# Patient Record
Sex: Male | Born: 1975
Health system: Southern US, Community
[De-identification: ages and names within clinical notes are randomized; demographics above are authoritative.]

## PROBLEM LIST (undated history)

## (undated) DIAGNOSIS — F410 Panic disorder [episodic paroxysmal anxiety] without agoraphobia: Secondary | ICD-10-CM

---

## 2011-06-12 ENCOUNTER — Encounter (HOSPITAL_COMMUNITY): Payer: Self-pay | Admitting: Emergency Medicine

## 2011-06-12 ENCOUNTER — Emergency Department (HOSPITAL_COMMUNITY): Payer: BC Managed Care – PPO

## 2011-06-12 ENCOUNTER — Emergency Department (HOSPITAL_COMMUNITY)
Admission: EM | Admit: 2011-06-12 | Discharge: 2011-06-12 | Disposition: A | Discharge status: Home / Self Care | Payer: BC Managed Care – PPO | Attending: Emergency Medicine | Admitting: Emergency Medicine

## 2011-06-12 DIAGNOSIS — J189 Pneumonia, unspecified organism: Secondary | ICD-10-CM | POA: Insufficient documentation

## 2011-06-12 DIAGNOSIS — R42 Dizziness and giddiness: Secondary | ICD-10-CM | POA: Insufficient documentation

## 2011-06-12 DIAGNOSIS — M549 Dorsalgia, unspecified: Secondary | ICD-10-CM | POA: Insufficient documentation

## 2011-06-12 DIAGNOSIS — R079 Chest pain, unspecified: Secondary | ICD-10-CM | POA: Insufficient documentation

## 2011-06-12 HISTORY — DX: Panic disorder (episodic paroxysmal anxiety): F41.0

## 2011-06-12 LAB — CBC
MCH: 31.8 pg (ref 26.0–34.0)
MCHC: 36.5 g/dL — ABNORMAL HIGH (ref 30.0–36.0)
MCV: 87.1 fL (ref 78.0–100.0)
Platelets: 310 10*3/uL (ref 150–400)
RBC: 4.59 MIL/uL (ref 4.22–5.81)

## 2011-06-12 LAB — D-DIMER, QUANTITATIVE: D-Dimer, Quant: 0.47 ug/mL-FEU (ref 0.00–0.48)

## 2011-06-12 LAB — DIFFERENTIAL
Eosinophils Absolute: 0 10*3/uL (ref 0.0–0.7)
Eosinophils Relative: 0 % (ref 0–5)
Lymphs Abs: 1.9 10*3/uL (ref 0.7–4.0)
Monocytes Absolute: 1.2 10*3/uL — ABNORMAL HIGH (ref 0.1–1.0)
Neutrophils Relative %: 82 % — ABNORMAL HIGH (ref 43–77)

## 2011-06-12 LAB — POCT I-STAT, CHEM 8
Calcium, Ion: 1.16 mmol/L (ref 1.12–1.32)
Chloride: 106 mEq/L (ref 96–112)
Creatinine, Ser: 0.7 mg/dL (ref 0.50–1.35)
Glucose, Bld: 115 mg/dL — ABNORMAL HIGH (ref 70–99)
Hemoglobin: 14.6 g/dL (ref 13.0–17.0)
Potassium: 3.6 mEq/L (ref 3.5–5.1)

## 2011-06-12 MED ORDER — MOXIFLOXACIN HCL 400 MG PO TABS: 400.0000 mg | ORAL_TABLET | Freq: Every day | ORAL | Status: AC

## 2011-06-12 MED ORDER — ACETAMINOPHEN 325 MG PO TABS
650.0000 mg | ORAL_TABLET | Freq: Once | ORAL | Status: AC
  Administered 2011-06-12: 650 mg via ORAL
  Filled 2011-06-12: qty 2

## 2011-06-12 NOTE — ED Notes (Signed)
Also states has hx panic attacks

## 2011-06-12 NOTE — ED Provider Notes (Signed)
History     CSN: [id number]  Arrival date & time 06/12/11  4098   First MD Initiated Contact with Patient 06/12/11 1955      Chief Complaint  Patient presents with  . Near Syncope  . Chest Pain  . Back Pain    (Consider location/radiation/quality/duration/timing/severity/associated sxs/prior treatment) Patient is a 36 y.o. male presenting with chest pain and back pain. The history is provided by the patient.  Chest Pain Primary symptoms include dizziness. Pertinent negatives for primary symptoms include no shortness of breath, no abdominal pain, no nausea and no vomiting.  Dizziness also occurs with diaphoresis. Dizziness does not occur with nausea, vomiting or weakness.  Associated symptoms include diaphoresis.  Pertinent negatives for associated symptoms include no numbness and no weakness.    Back Pain  Associated symptoms include chest pain. Pertinent negatives include no numbness, no headaches, no abdominal pain and no weakness.   patient has had back pain for last 2 days. He states a good friend who was in his 30s died of stage IV lung cancer. He states he's not been dealing with it very well. He states he developed some pain in his upper right back. It is worse with movement or with palpation. He states it feels like a muscle cramp. He states he was at his kids preschool function and a friend asked about his other friend that died. He states after this she began to have chest pain and he became sweaty and felt like passing out he smokes occasionally. He states that his father had his first heart attack at 36 years old. Patient states the pain is worse with deep breathing. No fevers. No cough. Substance abuse. He has no history of heart problems.   Past Medical History  Diagnosis Date  . Panic attacks     History reviewed. No pertinent past surgical history.  History reviewed. No pertinent family history.  History  Substance Use Topics  . Smoking status: Current Some Day  Smoker  . Smokeless tobacco: Not on file  . Alcohol Use: Yes     occasionally      Review of Systems  Constitutional: Positive for diaphoresis. Negative for activity change and appetite change.  HENT: Negative for neck stiffness.   Eyes: Negative for pain.  Respiratory: Negative for chest tightness and shortness of breath.   Cardiovascular: Positive for chest pain. Negative for leg swelling.  Gastrointestinal: Negative for nausea, vomiting, abdominal pain and diarrhea.  Genitourinary: Negative for flank pain.  Musculoskeletal: Positive for back pain.  Skin: Negative for rash.  Neurological: Positive for dizziness. Negative for weakness, numbness and headaches.  Psychiatric/Behavioral: Negative for behavioral problems.    Allergies  Review of patient's allergies indicates no known allergies.  Home Medications   Current Outpatient Rx  Name Route Sig Dispense Refill  . ALPRAZOLAM 0.5 MG PO TABS Oral Take 0.5 mg by mouth 3 (three) times daily as needed. For anxiety.    Marland Kitchen BACLOFEN 10 MG PO TABS Oral Take 10 mg by mouth 3 (three) times daily.    . IBUPROFEN 800 MG PO TABS Oral Take 800 mg by mouth every 8 (eight) hours as needed. For pain.    . ADULT MULTIVITAMIN W/MINERALS CH Oral Take 1 tablet by mouth daily.    Marland Kitchen OMEPRAZOLE 20 MG PO CPDR Oral Take 20-40 mg by mouth daily.    Marland Kitchen MOXIFLOXACIN HCL 400 MG PO TABS Oral Take 1 tablet (400 mg total) by mouth daily. 7 tablet 0  BP 113/51  Pulse 81  Resp 20  SpO2 93%  Physical Exam  Nursing note and vitals reviewed. Constitutional: He is oriented to person, place, and time. He appears well-developed and well-nourished.  HENT:  Head: Normocephalic and atraumatic.  Eyes: EOM are normal. Pupils are equal, round, and reactive to light.  Neck: Normal range of motion. Neck supple.  Cardiovascular: Normal rate, regular rhythm and normal heart sounds.   No murmur heard. Pulmonary/Chest: Effort normal and breath sounds normal.    Abdominal: Soft. Bowel sounds are normal. He exhibits no distension and no mass. There is no tenderness. There is no rebound and no guarding.  Musculoskeletal: Normal range of motion. He exhibits no edema.       Mild tenderness to right paraspinal area in his mid-back. Not worse with movement. No rash.  Neurological: He is alert and oriented to person, place, and time. No cranial nerve deficit.  Skin: Skin is warm and dry.  Psychiatric: He has a normal mood and affect.    ED Course  Procedures (including critical care time)  Labs Reviewed  CBC - Abnormal; Notable for the following:    WBC 17.5 (*)    MCHC 36.5 (*)    All other components within normal limits  DIFFERENTIAL - Abnormal; Notable for the following:    Neutrophils Relative 82 (*)    Lymphocytes Relative 11 (*)    Neutro Abs 14.4 (*)    Monocytes Absolute 1.2 (*)    All other components within normal limits  POCT I-STAT, CHEM 8 - Abnormal; Notable for the following:    Glucose, Bld 115 (*)    All other components within normal limits  D-DIMER, QUANTITATIVE  TROPONIN I   Dg Chest 2 View  06/12/2011  *RADIOLOGY REPORT*  Clinical Data: Back pain and chest pain.  CHEST - 2 VIEW  Comparison: None  Findings: The cardiac silhouette, mediastinal and hilar contours are within normal limits.  Patchy right middle and lower lobe infiltrates are noted. Possible area of pleural calcification.  The left lung is clear except for minimal left basilar atelectasis.  No effusions.  IMPRESSION: Right basilar infiltrates and possible pleural calcification.  Original Report Authenticated By: P. Loralie Champagne, M.D.     1. CAP (community acquired pneumonia)      Date: 56  Rhythm: normal sinus rhythm  QRS Axis: normal  Intervals: normal  ST/T Wave abnormalities: normal  Conduction Disutrbances:none  Narrative Interpretation:   Old EKG Reviewed: unchanged    MDM  Right-sided back pain. White count is elevated and patient has  pneumonia on x-ray. Patient with antibiotics for followup his primary care Dr. EKG is reassuring. There are possible pleural calcifications that will need to be followed.Juliet Rude. Rubin Payor, MD 06/13/11 0000

## 2011-06-12 NOTE — Discharge Instructions (Signed)

## 2011-06-12 NOTE — ED Notes (Signed)
To ED via [facility] from kids [facility] function after having a near syncopal episode tonight. Has been under stress in the past week-- a good friend died last week, has a "knot in back" muscle spasm under Right Scapula, hurts to breath, move,

## 2013-02-04 IMAGING — CR DG CHEST 2V
2 series · 2 of 2 positions shown · non-contrast
Comparison: None

CLINICAL DATA: Back pain and chest pain.

CHEST - 2 VIEW

[w chest pa]
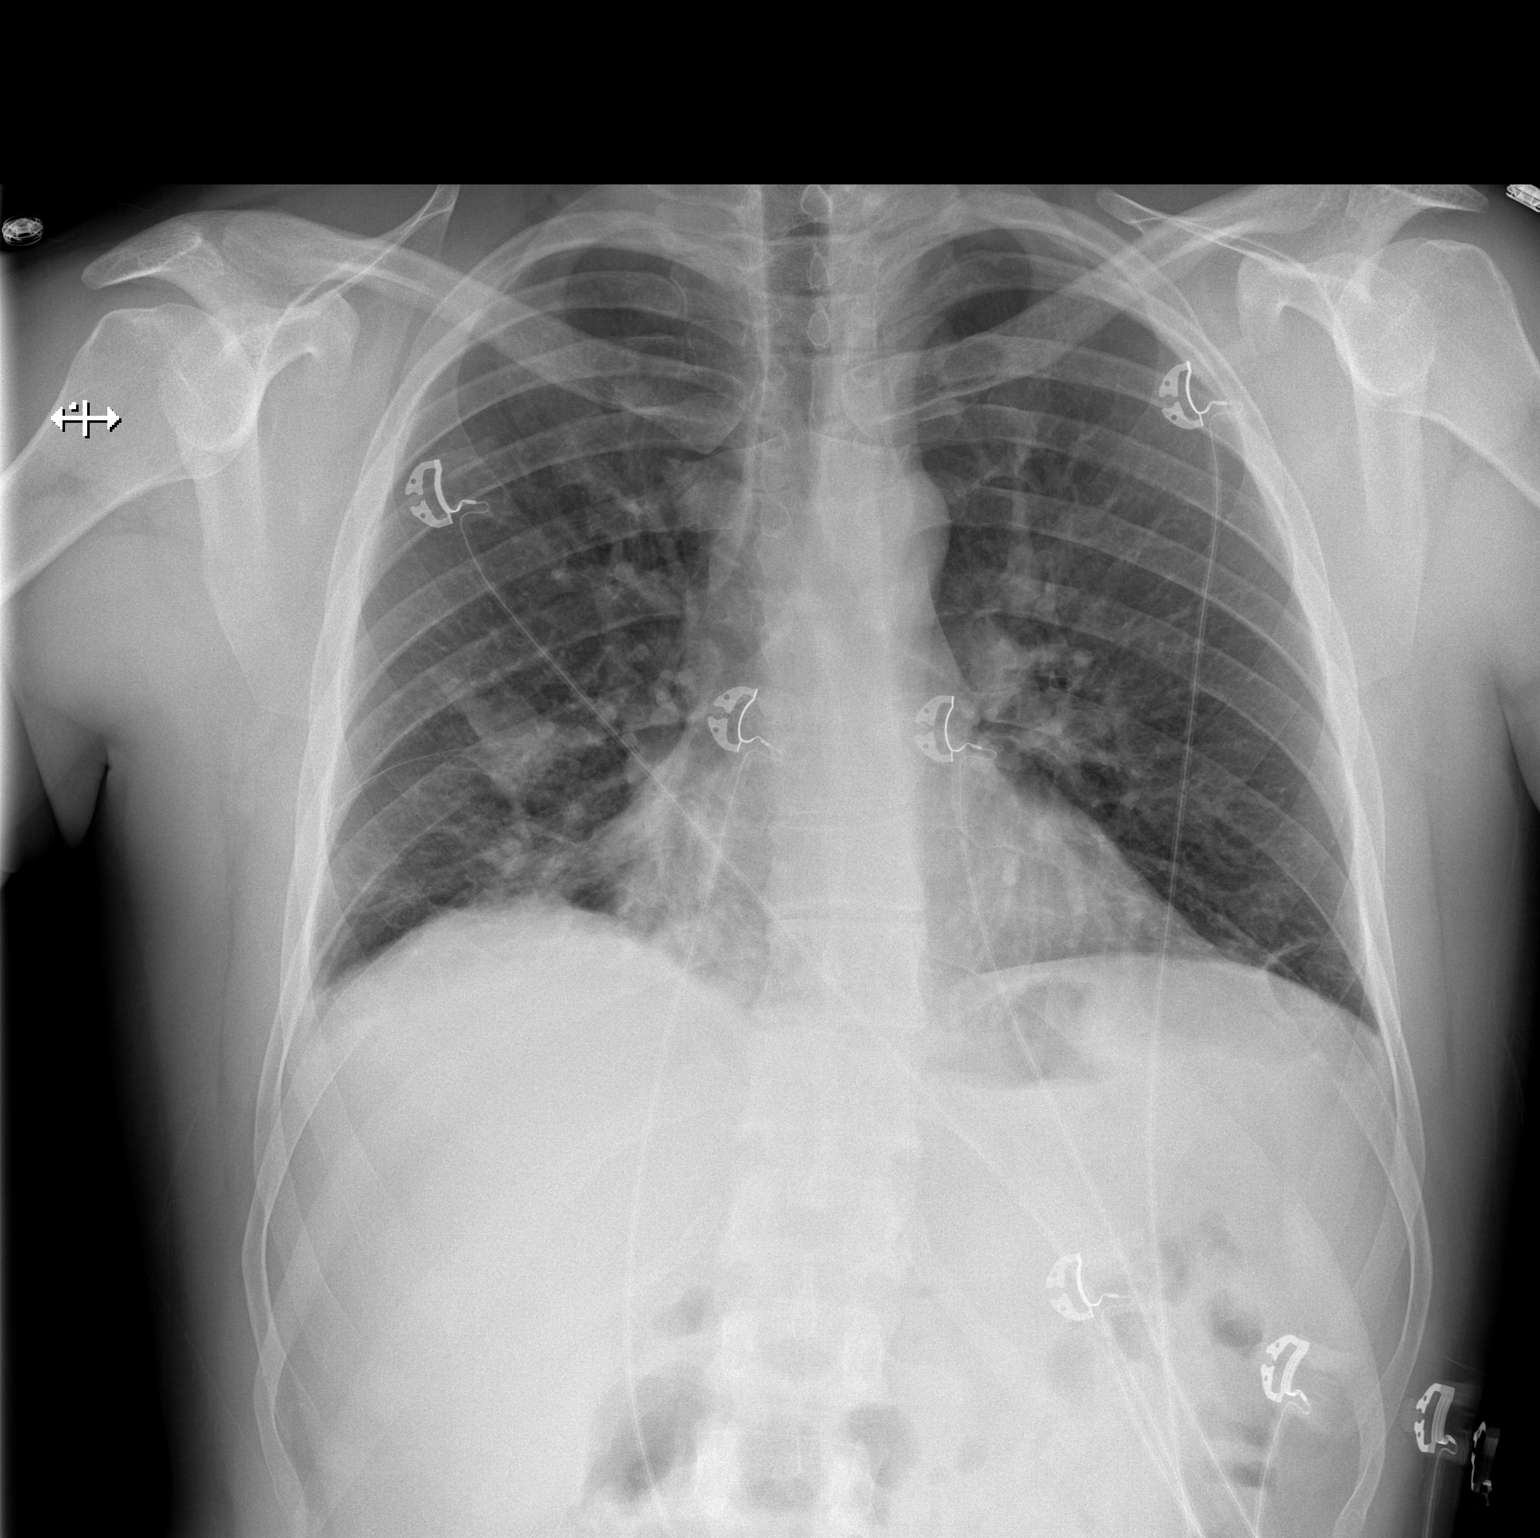

[w chest lat]
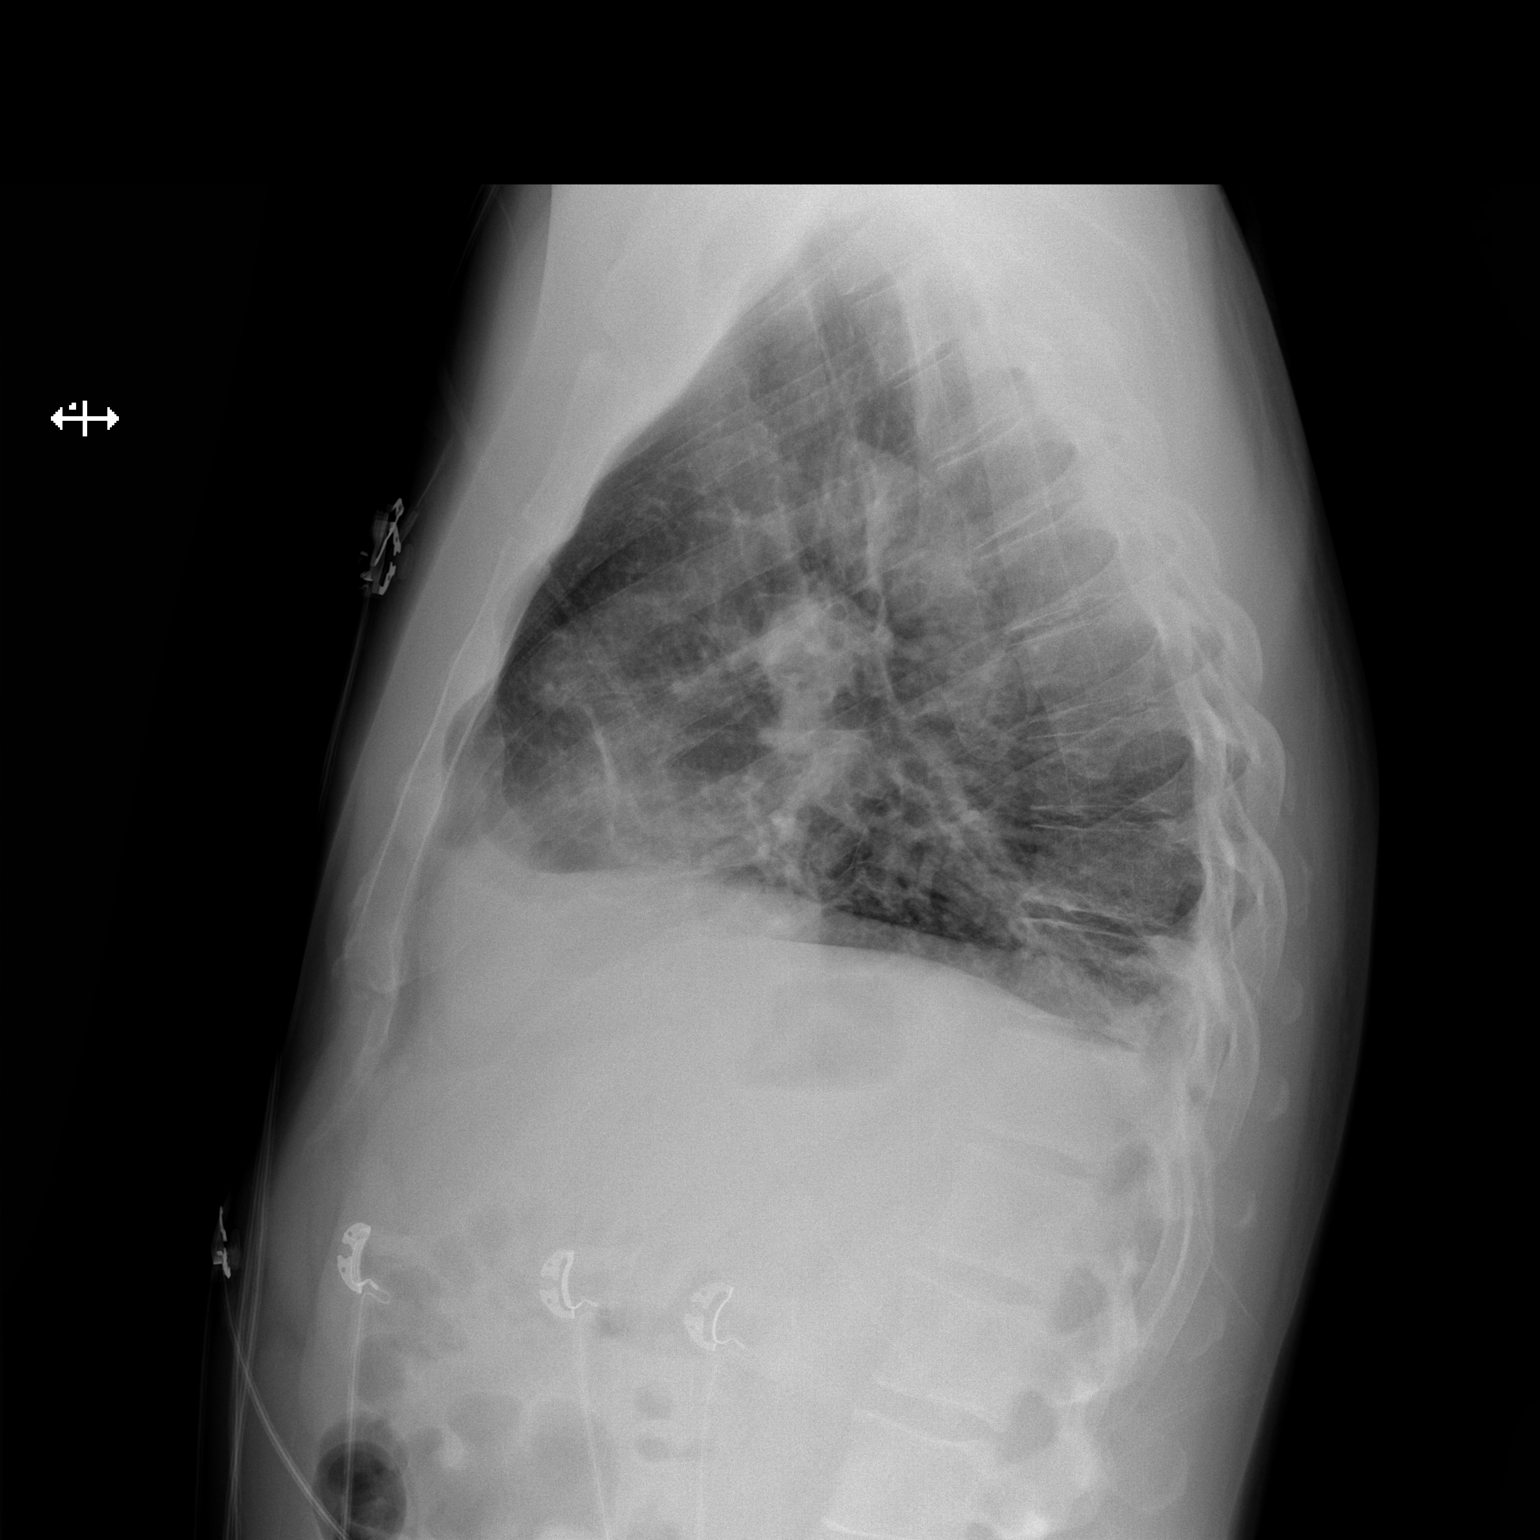

[2 of 2 positions shown; findings below may reference images not displayed]

FINDINGS: The cardiac silhouette, mediastinal and hilar contours
are within normal limits.  Patchy right middle and lower lobe
infiltrates are noted. Possible area of pleural calcification.  The
left lung is clear except for minimal left basilar atelectasis.  No
effusions.
IMPRESSION: Right basilar infiltrates and possible pleural calcification.

## 2019-07-16 DIAGNOSIS — Z03818 Encounter for observation for suspected exposure to other biological agents ruled out: Secondary | ICD-10-CM | POA: Diagnosis not present

## 2019-07-16 DIAGNOSIS — Z20822 Contact with and (suspected) exposure to covid-19: Secondary | ICD-10-CM | POA: Diagnosis not present

## 2019-07-28 DIAGNOSIS — K219 Gastro-esophageal reflux disease without esophagitis: Secondary | ICD-10-CM | POA: Diagnosis not present

## 2019-07-28 DIAGNOSIS — F419 Anxiety disorder, unspecified: Secondary | ICD-10-CM | POA: Diagnosis not present

## 2019-07-28 DIAGNOSIS — M25511 Pain in right shoulder: Secondary | ICD-10-CM | POA: Diagnosis not present

## 2019-07-28 DIAGNOSIS — Z13220 Encounter for screening for lipoid disorders: Secondary | ICD-10-CM | POA: Diagnosis not present

## 2019-07-28 DIAGNOSIS — Z0000 Encounter for general adult medical examination without abnormal findings: Secondary | ICD-10-CM | POA: Diagnosis not present

## 2019-09-06 DIAGNOSIS — D72829 Elevated white blood cell count, unspecified: Secondary | ICD-10-CM | POA: Diagnosis not present

## 2019-10-03 ENCOUNTER — Telehealth: Payer: Self-pay | Admitting: Hematology and Oncology

## 2019-10-03 NOTE — Telephone Encounter (Signed)
Mr. Retherford  Has been referred to our office for leukocytosis by Valora Piccolo, PA. Pt returned my call and has been scheduled to see Dr. Pamelia Hoit on 9/13 at 10:15am. Pt aware to ariive 15 minutes early. Letter mailed.

## 2019-10-23 NOTE — Progress Notes (Signed)
[facility] CONSULT NOTE  Patient Care Team: Mayer Camel, MD as PCP - General (Family Medicine)  CHIEF COMPLAINTS/PURPOSE OF CONSULTATION:  Newly diagnosed leukocytosis  HISTORY OF PRESENTING ILLNESS:  Thomas Nelson 44 y.o. male is here because of recent diagnosis of leukocytosis. He is referred by Anell Barr, PA. Labs on 07/28/19 showed WBC 11.9 and on 09/06/19 showed WBC 11.3, ANC 7.8, Hg 16.9, HCT 47.4, platelets 408. She presents to the clinic today for initial evaluation.  He tells me that he was a heavy alcoholic and had quit drinking alcohol about 5 years ago and since then his health has improved tremendously.  In fact in 2013 he had a white count that was markedly elevated up to 17.5 but it has slowly improved over time.  The most recent blood work showed normalization of liver function tests.  He stays active with exercising regularly.  I reviewed his records extensively and collaborated the history with the patient.   MEDICAL HISTORY:  Past Medical History:  Diagnosis Date   Panic attacks     SURGICAL HISTORY: No past surgical history on file.  SOCIAL HISTORY: Social History   Socioeconomic History   Marital status: Married    Spouse name: Not on file   Number of children: Not on file   Years of education: Not on file   Highest education level: Not on file  Occupational History   Not on file  Tobacco Use   Smoking status: Current Some Day Smoker  Substance and Sexual Activity   Alcohol use: Yes    Comment: occasionally   Drug use: No   Sexual activity: Not on file  Other Topics Concern   Not on file  Social History Narrative   Not on file   Social Determinants of Health   Financial Resource Strain:    Difficulty of Paying Living Expenses: Not on file  Food Insecurity:    Worried About Running Out of Food in the Last Year: Not on file   Ran Out of Food in the Last Year: Not on file  Transportation Needs:    Lack of  Transportation (Medical): Not on file   Lack of Transportation (Non-Medical): Not on file  Physical Activity:    Days of Exercise per Week: Not on file   Minutes of Exercise per Session: Not on file  Stress:    Feeling of Stress : Not on file  Social Connections:    Frequency of Communication with Friends and Family: Not on file   Frequency of Social Gatherings with Friends and Family: Not on file   Attends Religious Services: Not on file   Active Member of Clubs or Organizations: Not on file   Attends Banker Meetings: Not on file   Marital Status: Not on file  Intimate Partner Violence:    Fear of Current or Ex-Partner: Not on file   Emotionally Abused: Not on file   Physically Abused: Not on file   Sexually Abused: Not on file    FAMILY HISTORY: No family history on file.  ALLERGIES:  has No Known Allergies.  MEDICATIONS:  Current Outpatient Medications  Medication Sig Dispense Refill   omeprazole (PRILOSEC) 20 MG capsule Take 20-40 mg by mouth daily.     No current facility-administered medications for this visit.    REVIEW OF SYSTEMS:     All other systems were reviewed with the patient and are negative.  PHYSICAL EXAMINATION: ECOG PERFORMANCE STATUS: 1 - Symptomatic but  completely ambulatory  Vitals:   10/24/19 1027  BP: 106/77  Pulse: 77  Resp: 18  Temp: 97.7 F (36.5 C)  SpO2: 100%   Filed Weights   10/24/19 1027  Weight: 177 lb 8 oz (80.5 kg)      LABORATORY DATA:  I have reviewed the data as listed Lab Results  Component Value Date   WBC 17.5 (H) 06/12/2011   HGB 14.6 06/12/2011   HCT 43.0 06/12/2011   MCV 87.1 06/12/2011   PLT 310 06/12/2011   Lab Results  Component Value Date   NA 141 06/12/2011   K 3.6 06/12/2011   CL 106 06/12/2011    RADIOGRAPHIC STUDIES: I have personally reviewed the radiological reports and agreed with the findings in the report.  ASSESSMENT AND PLAN:  Leukocytosis Lab  review 06/12/2011: WBC 17.5, ANC 14.4, hemoglobin 14.6, platelets 310 07/28/19 WBC 11.9, ANC 7.7 09/06/19 WBC 11.3, ANC 7.8, Hg 16.9, HCT 47.4, platelets 408.  Differential diagnosis of chronic neutrophilia 1. Inflammations: The only probable inflammation is  2. infections: Unlikely 3. Medications: Patient does not take any steroids 4. Bone marrow disorders  Workup: Based on the lab results, no need to additional work ups since the elevated WBC is primarily elevated neutrophils which is most definitely related to underlying inflammation.     All questions were answered. The patient knows to call the clinic with any problems, questions or concerns.   Sabas Sous, MD, MPH 10/24/2019    I, Molly Dorshimer, am acting as scribe for Serena Croissant, MD.  I have reviewed the above documentation for accuracy and completeness, and I agree with the above.

## 2019-10-24 ENCOUNTER — Other Ambulatory Visit: Payer: Self-pay

## 2019-10-24 ENCOUNTER — Inpatient Hospital Stay: Payer: BC Managed Care – PPO | Attending: Hematology and Oncology | Admitting: Hematology and Oncology

## 2019-10-24 DIAGNOSIS — D72829 Elevated white blood cell count, unspecified: Secondary | ICD-10-CM | POA: Diagnosis not present

## 2019-10-24 DIAGNOSIS — D72828 Other elevated white blood cell count: Secondary | ICD-10-CM | POA: Diagnosis not present

## 2019-10-24 DIAGNOSIS — F17200 Nicotine dependence, unspecified, uncomplicated: Secondary | ICD-10-CM | POA: Insufficient documentation

## 2019-10-24 NOTE — Assessment & Plan Note (Addendum)
Lab review 06/12/2011: WBC 17.5, ANC 14.4, hemoglobin 14.6, platelets 310 07/28/19 WBC 11.9, ANC 7.7 09/06/19 WBC 11.3, ANC 7.8, Hg 16.9, HCT 47.4, platelets 408.  Differential diagnosis of chronic neutrophilia 1. Inflammations: The only probable inflammation is  2. infections: Unlikely 3. Medications: Patient does not take any steroids 4. Bone marrow disorders  Workup: Based on the lab results, no need to additional work ups since the elevated WBC is primarily elevated neutrophils which is most definitely related to underlying inflammation.  Elevated the hemoglobin levels: I discussed the patient about the differential diagnosis of polycythemia differential between primary versus secondary. I plan to obtain JAK2 mutation testing. We discussed the different causes of secondary polycythemia including obstructive sleep apnea and other hypoxia treatment states

## 2019-10-25 ENCOUNTER — Telehealth: Payer: Self-pay | Admitting: Hematology and Oncology

## 2019-10-25 NOTE — Telephone Encounter (Signed)
No 9/13 los, no changes made to pt schedule  

## 2020-01-27 DIAGNOSIS — F419 Anxiety disorder, unspecified: Secondary | ICD-10-CM | POA: Diagnosis not present

## 2020-01-27 DIAGNOSIS — R002 Palpitations: Secondary | ICD-10-CM | POA: Diagnosis not present

## 2020-01-29 DIAGNOSIS — I499 Cardiac arrhythmia, unspecified: Secondary | ICD-10-CM | POA: Diagnosis not present

## 2020-03-08 DIAGNOSIS — F410 Panic disorder [episodic paroxysmal anxiety] without agoraphobia: Secondary | ICD-10-CM | POA: Diagnosis not present

## 2020-03-08 DIAGNOSIS — F4322 Adjustment disorder with anxiety: Secondary | ICD-10-CM | POA: Diagnosis not present

## 2020-04-05 DIAGNOSIS — F410 Panic disorder [episodic paroxysmal anxiety] without agoraphobia: Secondary | ICD-10-CM | POA: Diagnosis not present

## 2020-04-05 DIAGNOSIS — F4322 Adjustment disorder with anxiety: Secondary | ICD-10-CM | POA: Diagnosis not present

## 2020-06-29 DIAGNOSIS — F40298 Other specified phobia: Secondary | ICD-10-CM | POA: Diagnosis not present

## 2020-06-29 DIAGNOSIS — F4322 Adjustment disorder with anxiety: Secondary | ICD-10-CM | POA: Diagnosis not present

## 2020-07-06 DIAGNOSIS — F4322 Adjustment disorder with anxiety: Secondary | ICD-10-CM | POA: Diagnosis not present

## 2020-07-06 DIAGNOSIS — F40298 Other specified phobia: Secondary | ICD-10-CM | POA: Diagnosis not present

## 2020-07-13 DIAGNOSIS — F4322 Adjustment disorder with anxiety: Secondary | ICD-10-CM | POA: Diagnosis not present

## 2020-07-13 DIAGNOSIS — F40298 Other specified phobia: Secondary | ICD-10-CM | POA: Diagnosis not present

## 2020-07-20 DIAGNOSIS — F40298 Other specified phobia: Secondary | ICD-10-CM | POA: Diagnosis not present

## 2020-07-20 DIAGNOSIS — F4322 Adjustment disorder with anxiety: Secondary | ICD-10-CM | POA: Diagnosis not present

## 2020-07-26 ENCOUNTER — Ambulatory Visit: Payer: BC Managed Care – PPO | Admitting: Behavioral Health

## 2020-08-01 ENCOUNTER — Ambulatory Visit (INDEPENDENT_AMBULATORY_CARE_PROVIDER_SITE_OTHER): Payer: BC Managed Care – PPO | Admitting: Behavioral Health

## 2020-08-01 ENCOUNTER — Encounter: Payer: Self-pay | Admitting: Behavioral Health

## 2020-08-01 ENCOUNTER — Other Ambulatory Visit: Payer: Self-pay

## 2020-08-01 VITALS — BP 91/55 | HR 81 | Ht 69.0 in | Wt 180.0 lb

## 2020-08-01 DIAGNOSIS — F330 Major depressive disorder, recurrent, mild: Secondary | ICD-10-CM

## 2020-08-01 DIAGNOSIS — F409 Phobic anxiety disorder, unspecified: Secondary | ICD-10-CM

## 2020-08-01 DIAGNOSIS — F411 Generalized anxiety disorder: Secondary | ICD-10-CM | POA: Diagnosis not present

## 2020-08-01 DIAGNOSIS — F4001 Agoraphobia with panic disorder: Secondary | ICD-10-CM | POA: Diagnosis not present

## 2020-08-01 MED ORDER — HYDROXYZINE HCL 50 MG PO TABS: ORAL_TABLET | ORAL | 1 refills | Status: AC

## 2020-08-01 MED ORDER — ALPRAZOLAM 0.5 MG PO TABS: 0.5000 mg | ORAL_TABLET | Freq: Two times a day (BID) | ORAL | 0 refills | Status: AC | PRN

## 2020-08-01 MED ORDER — SERTRALINE HCL 50 MG PO TABS: ORAL_TABLET | ORAL | 1 refills | Status: AC

## 2020-08-01 NOTE — Progress Notes (Signed)
[facility] MD/PA/NP Initial Note  08/01/2020 4:02 PM Thomas Nelson  MRN:  [id number]  Chief Complaint:   HPI:  45 year old male presents to this office for initial visit and to establish care. He says that he has a history of alcohol abuse but has been ETOH free for 6 years. Says he still practices techniques  and has no desire to drink. Says this past year that he has been going through separation and pending divorce with his wife. He has been married since 2006. Says that his wife has moved on prior to divorce being finalized with another woman. Says his children splits time and stays with him every other weekend. Says that the added stress has increase his anxiety levels. He said that over the last few years he has tried several other medication for anxiety and depression but did not take them for long periods. Says the last medication he was on was zoloft  50 mg over one year ago. Says he cannot remember if it really worked or not. 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 Says he can only take short cuts or cut through's. He was recently driving the back roads to [place] with his children and he got stuck with no way to get back home except the highway. Says he experienced palpitations, trouble breathing and "sensory overload" with the thought of getting on the highway. He is taking a train today to [place] because he cannot drive on highway. He does not understand how this phobia developed or what triggered it. He says that he would like to start SSRI again along with medication to help his panic attacks. He reports his anxiety today at 6/10 and depression 2/10. He reports sleeping 7-8 hours per night.No mania, no psychosis, No SI/HI.  Prior psychiatric medication trials : Wellbutrin: was only  two days. Said made him cry Paxil Zoloft   Visit Diagnosis:    ICD-10-CM   1. 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 No pertinent surgical history.  Family Psychiatric History: see chart  Family History:  Family History  Problem Relation Age of Onset   Alcohol abuse Mother    Anxiety disorder Mother     Social History:  Social History   Socioeconomic History   Marital status: Married    Spouse name: Theatre manager   Number of children: 3   Years of education: Not on file   Highest education level: Associate degree: academic program  Occupational History   Occupation: Insurance  Tobacco Use   Smoking status: Some Days    Pack years: 0.00   Smokeless tobacco: Not on file  Vaping Use   Vaping Use: Every day   Substances: Nicotine  Substance and Sexual Activity   Alcohol use: Not Currently    Comment: occasionally  Drug use: No   Sexual activity: Not Currently  Other Topics Concern   Not on file  Social History Narrative   Live alone. Legally separated. Has children every other weekend.    Social Determinants of Health   Financial Resource Strain: Not on file  Food Insecurity: Not on file  Transportation Needs: Not on file  Physical Activity: Not on file  Stress: Not on file  Social Connections: Not on file    Allergies: No Known Allergies  Metabolic Disorder Labs: No results found for: HGBA1C, MPG No results found for: PROLACTIN No results found for: CHOL, TRIG, HDL, CHOLHDL, VLDL, LDLCALC No results found for: TSH  Therapeutic Level Labs: No  results found for: LITHIUM No results found for: VALPROATE No components found for:  CBMZ  Current Medications: Current Outpatient Medications  Medication Sig Dispense Refill   ALPRAZolam (XANAX) 0.5 MG tablet Take 1 tablet (0.5 mg total) by mouth 2 (two) times daily as needed for anxiety. 28 tablet 0   hydrOXYzine (ATARAX/VISTARIL) 50 MG tablet 1/2 tablet 25 mg for 7 days, then one whole tablet 50 mg. 30 tablet 1   sertraline (ZOLOFT) 50 MG tablet 1/2 tablet 25 mg for 7 days, then one whole tablet 50 mg. 30 tablet 1   omeprazole (PRILOSEC) 20 MG capsule Take 20-40 mg by mouth daily.     No current facility-administered medications for this visit.    Medication Side Effects: none  Orders placed this visit:  No orders of the defined types were placed in this encounter.   Psychiatric Specialty Exam:  Review of Systems  Musculoskeletal:  Negative for gait problem.  Neurological:  Negative for tremors.   There were no vitals taken for this visit.There is no height or weight on file to calculate BMI.  General Appearance: Casual, Neat, and Well Groomed  Eye Contact:  Good  Speech:  Clear and Coherent and Talkative  Volume:  Normal  Mood:  Anxious  Affect:  Appropriate  Thought Process:  Coherent  Orientation:  Full (Time, Place, and Person)  Thought Content: Logical   Suicidal Thoughts:  No  Homicidal Thoughts:  No  Memory:  WNL  Judgement:  Good  Insight:  Good  Psychomotor Activity:  Normal  Concentration:  Concentration: Good  Recall:  Good  Fund of Knowledge: Good  Language: Good  Assets:  Desire for Improvement Resilience  ADL's:  Intact  Cognition: WNL  Prognosis:  Good   Screenings:  PHQ2-9    Flowsheet Row Office Visit from 08/01/2020 in [facility]  PHQ-2 Total Score 1       Receiving Psychotherapy: Yes Pecola Lawless  Treatment Plan/Recommendations:  Greater than 50% of 45 min face to face time with patient was spent on counseling  and coordination of care. We discussed history of anxiety and panic attacks. Discussed history of ETOH and progress.We discussed Discussed potential benefits, risk, and side effects of benzodiazepines to include potential risk of tolerance and dependence, as well as possible drowsiness.  Advised patient not to drive if experiencing drowsiness and to take lowest possible effective dose to minimize risk of dependence and tolerance.  Will start hydroxyzine 25 mg three times daily prn for 7 days. Then may increase to 50 mg three times daily prn for anxiety or sleep. One refill Start  #28 xanax 0.5 mg twice daily prn. Use sparingly if hydroxyzine has failed to control anxiety or panic Start Zoloft 1/2 tablet 25 mg for 7 days, then one  whole tablet 50 mg.  Will report and side effect or worsening symptoms Follow up in 4 weeks to reassess  Patient advised to recheck his BP regularly. Low Conduct GAD 7 next visit    Joan Flores, NP

## 2020-08-03 DIAGNOSIS — F40298 Other specified phobia: Secondary | ICD-10-CM | POA: Diagnosis not present

## 2020-08-03 DIAGNOSIS — F4322 Adjustment disorder with anxiety: Secondary | ICD-10-CM | POA: Diagnosis not present

## 2020-08-14 ENCOUNTER — Telehealth: Payer: Self-pay | Admitting: Behavioral Health

## 2020-08-14 NOTE — Telephone Encounter (Signed)
Pt informed

## 2020-08-14 NOTE — Telephone Encounter (Signed)
Contact patient and tell him to decrease his dose back down to 25 mg until his next appointment with me. Provide emergency contact info and tell him to call back if problems persist after a few days.

## 2020-08-14 NOTE — Telephone Encounter (Signed)
Please review

## 2020-08-14 NOTE — Telephone Encounter (Signed)
Next visit is 08/31/20. Thomas Nelson called about his Zoloft. He was started on Zoloft 25 mg and then it was increased to 50 mg and this is his 2nd day for increase. Since he starting the increase he can't collect his thoughts and can't come up with words to say. He also said he has intrusive thoughts and doesn't feel good about the thoughts. His phone number is [phone]. Pharmacy is [facility], [address] [place], [place]. Phone number is [phone].

## 2020-08-20 DIAGNOSIS — F4322 Adjustment disorder with anxiety: Secondary | ICD-10-CM | POA: Diagnosis not present

## 2020-08-20 DIAGNOSIS — F40298 Other specified phobia: Secondary | ICD-10-CM | POA: Diagnosis not present

## 2020-08-21 ENCOUNTER — Telehealth: Payer: Self-pay | Admitting: Behavioral Health

## 2020-08-21 NOTE — Telephone Encounter (Signed)
Pt stated he has extreme fatigue and he switched to taking at nighttime and it did not help. He is lethargic and in a "fog". He wants to stop and needs to know if he has to wean off

## 2020-08-21 NOTE — Telephone Encounter (Signed)
Half tablet for one week and then 1/4 tablet for one week and then stop.

## 2020-08-21 NOTE — Telephone Encounter (Signed)
Pt called in asking how he can completely stop taking Sertraline. Pt said that it is not working foe him at all. Please call [phone]

## 2020-08-21 NOTE — Telephone Encounter (Signed)
LVM for pt to return call

## 2020-08-21 NOTE — Telephone Encounter (Signed)
LVM

## 2020-08-21 NOTE — Telephone Encounter (Signed)
The sertraline.

## 2020-08-21 NOTE — Telephone Encounter (Signed)
What medication? He is on several.

## 2020-08-31 ENCOUNTER — Ambulatory Visit: Payer: BC Managed Care – PPO | Admitting: Behavioral Health

## 2020-09-07 ENCOUNTER — Ambulatory Visit: Payer: BC Managed Care – PPO | Admitting: Behavioral Health

## 2020-09-14 DIAGNOSIS — F40298 Other specified phobia: Secondary | ICD-10-CM | POA: Diagnosis not present

## 2020-09-14 DIAGNOSIS — F4322 Adjustment disorder with anxiety: Secondary | ICD-10-CM | POA: Diagnosis not present

## 2020-09-27 ENCOUNTER — Ambulatory Visit: Payer: BC Managed Care – PPO | Admitting: Behavioral Health

## 2020-10-04 DIAGNOSIS — F4322 Adjustment disorder with anxiety: Secondary | ICD-10-CM | POA: Diagnosis not present

## 2020-10-04 DIAGNOSIS — F40298 Other specified phobia: Secondary | ICD-10-CM | POA: Diagnosis not present

## 2020-10-18 DIAGNOSIS — F4322 Adjustment disorder with anxiety: Secondary | ICD-10-CM | POA: Diagnosis not present

## 2020-10-18 DIAGNOSIS — F40298 Other specified phobia: Secondary | ICD-10-CM | POA: Diagnosis not present

## 2020-11-07 DIAGNOSIS — F4322 Adjustment disorder with anxiety: Secondary | ICD-10-CM | POA: Diagnosis not present

## 2020-11-07 DIAGNOSIS — F40298 Other specified phobia: Secondary | ICD-10-CM | POA: Diagnosis not present

## 2020-11-16 DIAGNOSIS — F4322 Adjustment disorder with anxiety: Secondary | ICD-10-CM | POA: Diagnosis not present

## 2020-11-16 DIAGNOSIS — F40298 Other specified phobia: Secondary | ICD-10-CM | POA: Diagnosis not present

## 2020-11-30 DIAGNOSIS — F40298 Other specified phobia: Secondary | ICD-10-CM | POA: Diagnosis not present

## 2020-11-30 DIAGNOSIS — F4322 Adjustment disorder with anxiety: Secondary | ICD-10-CM | POA: Diagnosis not present

## 2020-12-27 DIAGNOSIS — F4322 Adjustment disorder with anxiety: Secondary | ICD-10-CM | POA: Diagnosis not present

## 2020-12-27 DIAGNOSIS — F40298 Other specified phobia: Secondary | ICD-10-CM | POA: Diagnosis not present

## 2021-01-10 DIAGNOSIS — F4322 Adjustment disorder with anxiety: Secondary | ICD-10-CM | POA: Diagnosis not present

## 2021-01-10 DIAGNOSIS — F40298 Other specified phobia: Secondary | ICD-10-CM | POA: Diagnosis not present

## 2021-01-29 DIAGNOSIS — F4322 Adjustment disorder with anxiety: Secondary | ICD-10-CM | POA: Diagnosis not present

## 2021-01-29 DIAGNOSIS — F40298 Other specified phobia: Secondary | ICD-10-CM | POA: Diagnosis not present

## 2021-02-21 DIAGNOSIS — F40298 Other specified phobia: Secondary | ICD-10-CM | POA: Diagnosis not present

## 2021-02-21 DIAGNOSIS — F4322 Adjustment disorder with anxiety: Secondary | ICD-10-CM | POA: Diagnosis not present

## 2021-03-14 DIAGNOSIS — F40298 Other specified phobia: Secondary | ICD-10-CM | POA: Diagnosis not present

## 2021-03-14 DIAGNOSIS — F4322 Adjustment disorder with anxiety: Secondary | ICD-10-CM | POA: Diagnosis not present

## 2021-04-10 DIAGNOSIS — M25511 Pain in right shoulder: Secondary | ICD-10-CM | POA: Diagnosis not present

## 2021-04-10 DIAGNOSIS — Z23 Encounter for immunization: Secondary | ICD-10-CM | POA: Diagnosis not present

## 2021-04-10 DIAGNOSIS — Z0000 Encounter for general adult medical examination without abnormal findings: Secondary | ICD-10-CM | POA: Diagnosis not present

## 2021-04-10 DIAGNOSIS — J069 Acute upper respiratory infection, unspecified: Secondary | ICD-10-CM | POA: Diagnosis not present

## 2021-04-10 DIAGNOSIS — F419 Anxiety disorder, unspecified: Secondary | ICD-10-CM | POA: Diagnosis not present

## 2021-04-10 DIAGNOSIS — K219 Gastro-esophageal reflux disease without esophagitis: Secondary | ICD-10-CM | POA: Diagnosis not present

## 2021-04-11 DIAGNOSIS — F40298 Other specified phobia: Secondary | ICD-10-CM | POA: Diagnosis not present

## 2021-04-11 DIAGNOSIS — F4322 Adjustment disorder with anxiety: Secondary | ICD-10-CM | POA: Diagnosis not present

## 2021-04-29 DIAGNOSIS — F40298 Other specified phobia: Secondary | ICD-10-CM | POA: Diagnosis not present

## 2021-04-29 DIAGNOSIS — F4322 Adjustment disorder with anxiety: Secondary | ICD-10-CM | POA: Diagnosis not present

## 2021-05-01 ENCOUNTER — Encounter: Payer: Self-pay | Admitting: *Deleted

## 2021-05-08 DIAGNOSIS — M25511 Pain in right shoulder: Secondary | ICD-10-CM | POA: Diagnosis not present

## 2021-05-21 DIAGNOSIS — F4322 Adjustment disorder with anxiety: Secondary | ICD-10-CM | POA: Diagnosis not present

## 2021-05-21 DIAGNOSIS — F40298 Other specified phobia: Secondary | ICD-10-CM | POA: Diagnosis not present

## 2021-06-18 DIAGNOSIS — F40298 Other specified phobia: Secondary | ICD-10-CM | POA: Diagnosis not present

## 2021-06-18 DIAGNOSIS — F4322 Adjustment disorder with anxiety: Secondary | ICD-10-CM | POA: Diagnosis not present

## 2021-07-02 DIAGNOSIS — F4322 Adjustment disorder with anxiety: Secondary | ICD-10-CM | POA: Diagnosis not present

## 2021-07-02 DIAGNOSIS — F40298 Other specified phobia: Secondary | ICD-10-CM | POA: Diagnosis not present

## 2021-07-08 DIAGNOSIS — F410 Panic disorder [episodic paroxysmal anxiety] without agoraphobia: Secondary | ICD-10-CM | POA: Diagnosis not present

## 2021-07-11 DIAGNOSIS — F410 Panic disorder [episodic paroxysmal anxiety] without agoraphobia: Secondary | ICD-10-CM | POA: Diagnosis not present

## 2021-07-16 DIAGNOSIS — L03011 Cellulitis of right finger: Secondary | ICD-10-CM | POA: Diagnosis not present

## 2021-07-30 DIAGNOSIS — F410 Panic disorder [episodic paroxysmal anxiety] without agoraphobia: Secondary | ICD-10-CM | POA: Diagnosis not present

## 2021-07-31 DIAGNOSIS — F4322 Adjustment disorder with anxiety: Secondary | ICD-10-CM | POA: Diagnosis not present

## 2021-07-31 DIAGNOSIS — F40298 Other specified phobia: Secondary | ICD-10-CM | POA: Diagnosis not present

## 2021-08-01 DIAGNOSIS — H5203 Hypermetropia, bilateral: Secondary | ICD-10-CM | POA: Diagnosis not present

## 2021-08-20 DIAGNOSIS — F4322 Adjustment disorder with anxiety: Secondary | ICD-10-CM | POA: Diagnosis not present

## 2021-08-20 DIAGNOSIS — F40298 Other specified phobia: Secondary | ICD-10-CM | POA: Diagnosis not present

## 2021-09-03 DIAGNOSIS — F4001 Agoraphobia with panic disorder: Secondary | ICD-10-CM | POA: Diagnosis not present

## 2021-09-03 DIAGNOSIS — Z79899 Other long term (current) drug therapy: Secondary | ICD-10-CM | POA: Diagnosis not present

## 2021-09-03 DIAGNOSIS — F419 Anxiety disorder, unspecified: Secondary | ICD-10-CM | POA: Diagnosis not present

## 2021-09-17 DIAGNOSIS — F40298 Other specified phobia: Secondary | ICD-10-CM | POA: Diagnosis not present

## 2021-09-17 DIAGNOSIS — F4322 Adjustment disorder with anxiety: Secondary | ICD-10-CM | POA: Diagnosis not present

## 2021-10-03 DIAGNOSIS — F4001 Agoraphobia with panic disorder: Secondary | ICD-10-CM | POA: Diagnosis not present

## 2021-10-03 DIAGNOSIS — F419 Anxiety disorder, unspecified: Secondary | ICD-10-CM | POA: Diagnosis not present

## 2021-10-08 DIAGNOSIS — F4322 Adjustment disorder with anxiety: Secondary | ICD-10-CM | POA: Diagnosis not present

## 2021-10-08 DIAGNOSIS — F40298 Other specified phobia: Secondary | ICD-10-CM | POA: Diagnosis not present

## 2021-10-29 DIAGNOSIS — F40298 Other specified phobia: Secondary | ICD-10-CM | POA: Diagnosis not present

## 2021-10-29 DIAGNOSIS — F4322 Adjustment disorder with anxiety: Secondary | ICD-10-CM | POA: Diagnosis not present

## 2021-11-07 DIAGNOSIS — F419 Anxiety disorder, unspecified: Secondary | ICD-10-CM | POA: Diagnosis not present

## 2021-11-07 DIAGNOSIS — F4001 Agoraphobia with panic disorder: Secondary | ICD-10-CM | POA: Diagnosis not present

## 2021-12-04 DIAGNOSIS — M25511 Pain in right shoulder: Secondary | ICD-10-CM | POA: Diagnosis not present

## 2021-12-12 DIAGNOSIS — M25511 Pain in right shoulder: Secondary | ICD-10-CM | POA: Diagnosis not present

## 2021-12-16 DIAGNOSIS — M25511 Pain in right shoulder: Secondary | ICD-10-CM | POA: Diagnosis not present

## 2022-02-20 DIAGNOSIS — R0981 Nasal congestion: Secondary | ICD-10-CM | POA: Diagnosis not present

## 2022-02-20 DIAGNOSIS — J101 Influenza due to other identified influenza virus with other respiratory manifestations: Secondary | ICD-10-CM | POA: Diagnosis not present

## 2022-03-06 DIAGNOSIS — F40298 Other specified phobia: Secondary | ICD-10-CM | POA: Diagnosis not present

## 2022-03-06 DIAGNOSIS — F4322 Adjustment disorder with anxiety: Secondary | ICD-10-CM | POA: Diagnosis not present

## 2022-03-11 DIAGNOSIS — F419 Anxiety disorder, unspecified: Secondary | ICD-10-CM | POA: Diagnosis not present

## 2022-03-11 DIAGNOSIS — F4001 Agoraphobia with panic disorder: Secondary | ICD-10-CM | POA: Diagnosis not present

## 2022-03-13 DIAGNOSIS — F4322 Adjustment disorder with anxiety: Secondary | ICD-10-CM | POA: Diagnosis not present

## 2022-03-13 DIAGNOSIS — F40298 Other specified phobia: Secondary | ICD-10-CM | POA: Diagnosis not present

## 2022-03-28 DIAGNOSIS — F4322 Adjustment disorder with anxiety: Secondary | ICD-10-CM | POA: Diagnosis not present

## 2022-03-28 DIAGNOSIS — F40298 Other specified phobia: Secondary | ICD-10-CM | POA: Diagnosis not present

## 2022-04-14 DIAGNOSIS — L729 Follicular cyst of the skin and subcutaneous tissue, unspecified: Secondary | ICD-10-CM | POA: Diagnosis not present

## 2022-04-14 DIAGNOSIS — Z0000 Encounter for general adult medical examination without abnormal findings: Secondary | ICD-10-CM | POA: Diagnosis not present

## 2022-04-14 DIAGNOSIS — F419 Anxiety disorder, unspecified: Secondary | ICD-10-CM | POA: Diagnosis not present

## 2022-04-14 DIAGNOSIS — Z13220 Encounter for screening for lipoid disorders: Secondary | ICD-10-CM | POA: Diagnosis not present

## 2022-04-14 DIAGNOSIS — R7301 Impaired fasting glucose: Secondary | ICD-10-CM | POA: Diagnosis not present

## 2022-04-16 DIAGNOSIS — F40298 Other specified phobia: Secondary | ICD-10-CM | POA: Diagnosis not present

## 2022-04-16 DIAGNOSIS — F4322 Adjustment disorder with anxiety: Secondary | ICD-10-CM | POA: Diagnosis not present

## 2022-05-16 DIAGNOSIS — F4322 Adjustment disorder with anxiety: Secondary | ICD-10-CM | POA: Diagnosis not present

## 2022-05-16 DIAGNOSIS — F40298 Other specified phobia: Secondary | ICD-10-CM | POA: Diagnosis not present

## 2022-05-21 DIAGNOSIS — M75121 Complete rotator cuff tear or rupture of right shoulder, not specified as traumatic: Secondary | ICD-10-CM | POA: Diagnosis not present

## 2022-06-06 DIAGNOSIS — F4322 Adjustment disorder with anxiety: Secondary | ICD-10-CM | POA: Diagnosis not present

## 2022-06-06 DIAGNOSIS — F40298 Other specified phobia: Secondary | ICD-10-CM | POA: Diagnosis not present

## 2022-06-12 DIAGNOSIS — G8918 Other acute postprocedural pain: Secondary | ICD-10-CM | POA: Diagnosis not present

## 2022-06-12 DIAGNOSIS — M25711 Osteophyte, right shoulder: Secondary | ICD-10-CM | POA: Diagnosis not present

## 2022-06-12 DIAGNOSIS — S46111A Strain of muscle, fascia and tendon of long head of biceps, right arm, initial encounter: Secondary | ICD-10-CM | POA: Diagnosis not present

## 2022-06-12 DIAGNOSIS — M75121 Complete rotator cuff tear or rupture of right shoulder, not specified as traumatic: Secondary | ICD-10-CM | POA: Diagnosis not present

## 2022-06-26 DIAGNOSIS — F4322 Adjustment disorder with anxiety: Secondary | ICD-10-CM | POA: Diagnosis not present

## 2022-06-26 DIAGNOSIS — F40298 Other specified phobia: Secondary | ICD-10-CM | POA: Diagnosis not present

## 2022-06-30 DIAGNOSIS — L729 Follicular cyst of the skin and subcutaneous tissue, unspecified: Secondary | ICD-10-CM | POA: Diagnosis not present

## 2022-07-08 DIAGNOSIS — M25511 Pain in right shoulder: Secondary | ICD-10-CM | POA: Diagnosis not present

## 2022-07-11 DIAGNOSIS — L728 Other follicular cysts of the skin and subcutaneous tissue: Secondary | ICD-10-CM | POA: Diagnosis not present

## 2022-07-11 DIAGNOSIS — L7211 Pilar cyst: Secondary | ICD-10-CM | POA: Diagnosis not present

## 2022-07-11 DIAGNOSIS — L910 Hypertrophic scar: Secondary | ICD-10-CM | POA: Diagnosis not present

## 2022-07-15 DIAGNOSIS — F4001 Agoraphobia with panic disorder: Secondary | ICD-10-CM | POA: Diagnosis not present

## 2022-07-15 DIAGNOSIS — F419 Anxiety disorder, unspecified: Secondary | ICD-10-CM | POA: Diagnosis not present

## 2022-07-24 DIAGNOSIS — M75121 Complete rotator cuff tear or rupture of right shoulder, not specified as traumatic: Secondary | ICD-10-CM | POA: Diagnosis not present

## 2022-07-24 DIAGNOSIS — M25511 Pain in right shoulder: Secondary | ICD-10-CM | POA: Diagnosis not present

## 2022-07-24 DIAGNOSIS — R531 Weakness: Secondary | ICD-10-CM | POA: Diagnosis not present

## 2022-07-28 DIAGNOSIS — M75121 Complete rotator cuff tear or rupture of right shoulder, not specified as traumatic: Secondary | ICD-10-CM | POA: Diagnosis not present

## 2022-08-06 DIAGNOSIS — M25511 Pain in right shoulder: Secondary | ICD-10-CM | POA: Diagnosis not present

## 2022-08-06 DIAGNOSIS — R531 Weakness: Secondary | ICD-10-CM | POA: Diagnosis not present

## 2022-08-06 DIAGNOSIS — M75121 Complete rotator cuff tear or rupture of right shoulder, not specified as traumatic: Secondary | ICD-10-CM | POA: Diagnosis not present

## 2022-08-20 DIAGNOSIS — R531 Weakness: Secondary | ICD-10-CM | POA: Diagnosis not present

## 2022-08-20 DIAGNOSIS — M75121 Complete rotator cuff tear or rupture of right shoulder, not specified as traumatic: Secondary | ICD-10-CM | POA: Diagnosis not present

## 2022-08-20 DIAGNOSIS — M25511 Pain in right shoulder: Secondary | ICD-10-CM | POA: Diagnosis not present

## 2022-08-26 DIAGNOSIS — F40298 Other specified phobia: Secondary | ICD-10-CM | POA: Diagnosis not present

## 2022-08-26 DIAGNOSIS — F4322 Adjustment disorder with anxiety: Secondary | ICD-10-CM | POA: Diagnosis not present

## 2022-09-01 DIAGNOSIS — M75121 Complete rotator cuff tear or rupture of right shoulder, not specified as traumatic: Secondary | ICD-10-CM | POA: Diagnosis not present

## 2022-09-01 DIAGNOSIS — R531 Weakness: Secondary | ICD-10-CM | POA: Diagnosis not present

## 2022-09-01 DIAGNOSIS — M25511 Pain in right shoulder: Secondary | ICD-10-CM | POA: Diagnosis not present

## 2022-09-08 DIAGNOSIS — R531 Weakness: Secondary | ICD-10-CM | POA: Diagnosis not present

## 2022-09-08 DIAGNOSIS — M75121 Complete rotator cuff tear or rupture of right shoulder, not specified as traumatic: Secondary | ICD-10-CM | POA: Diagnosis not present

## 2022-09-08 DIAGNOSIS — M25511 Pain in right shoulder: Secondary | ICD-10-CM | POA: Diagnosis not present

## 2022-10-01 DIAGNOSIS — F40298 Other specified phobia: Secondary | ICD-10-CM | POA: Diagnosis not present

## 2022-10-01 DIAGNOSIS — F4322 Adjustment disorder with anxiety: Secondary | ICD-10-CM | POA: Diagnosis not present

## 2022-10-29 DIAGNOSIS — F40298 Other specified phobia: Secondary | ICD-10-CM | POA: Diagnosis not present

## 2022-10-29 DIAGNOSIS — F4322 Adjustment disorder with anxiety: Secondary | ICD-10-CM | POA: Diagnosis not present

## 2022-11-03 DIAGNOSIS — M75121 Complete rotator cuff tear or rupture of right shoulder, not specified as traumatic: Secondary | ICD-10-CM | POA: Diagnosis not present

## 2022-12-18 DIAGNOSIS — F40298 Other specified phobia: Secondary | ICD-10-CM | POA: Diagnosis not present

## 2022-12-18 DIAGNOSIS — F4322 Adjustment disorder with anxiety: Secondary | ICD-10-CM | POA: Diagnosis not present

## 2023-01-12 DIAGNOSIS — F4001 Agoraphobia with panic disorder: Secondary | ICD-10-CM | POA: Diagnosis not present

## 2023-01-12 DIAGNOSIS — F419 Anxiety disorder, unspecified: Secondary | ICD-10-CM | POA: Diagnosis not present

## 2023-03-12 DIAGNOSIS — L723 Sebaceous cyst: Secondary | ICD-10-CM | POA: Diagnosis not present

## 2023-03-12 DIAGNOSIS — L7211 Pilar cyst: Secondary | ICD-10-CM | POA: Diagnosis not present

## 2023-03-12 DIAGNOSIS — D2261 Melanocytic nevi of right upper limb, including shoulder: Secondary | ICD-10-CM | POA: Diagnosis not present

## 2023-04-17 ENCOUNTER — Other Ambulatory Visit (HOSPITAL_BASED_OUTPATIENT_CLINIC_OR_DEPARTMENT_OTHER): Payer: Self-pay | Admitting: Family Medicine

## 2023-04-17 DIAGNOSIS — Z0000 Encounter for general adult medical examination without abnormal findings: Secondary | ICD-10-CM | POA: Diagnosis not present

## 2023-04-17 DIAGNOSIS — Z13220 Encounter for screening for lipoid disorders: Secondary | ICD-10-CM | POA: Diagnosis not present

## 2023-04-17 DIAGNOSIS — Z125 Encounter for screening for malignant neoplasm of prostate: Secondary | ICD-10-CM | POA: Diagnosis not present

## 2023-04-17 DIAGNOSIS — Z8249 Family history of ischemic heart disease and other diseases of the circulatory system: Secondary | ICD-10-CM

## 2023-04-17 DIAGNOSIS — F419 Anxiety disorder, unspecified: Secondary | ICD-10-CM | POA: Diagnosis not present

## 2023-05-01 ENCOUNTER — Ambulatory Visit (HOSPITAL_COMMUNITY)
Admission: RE | Admit: 2023-05-01 | Discharge: 2023-05-01 | Disposition: A | Discharge status: Home / Self Care | Payer: Self-pay | Source: Ambulatory Visit | Attending: Family Medicine | Admitting: Family Medicine

## 2023-05-01 DIAGNOSIS — Z8249 Family history of ischemic heart disease and other diseases of the circulatory system: Secondary | ICD-10-CM | POA: Insufficient documentation

## 2023-08-04 DIAGNOSIS — F419 Anxiety disorder, unspecified: Secondary | ICD-10-CM | POA: Diagnosis not present

## 2023-08-04 DIAGNOSIS — Z5181 Encounter for therapeutic drug level monitoring: Secondary | ICD-10-CM | POA: Diagnosis not present

## 2023-08-04 DIAGNOSIS — F4001 Agoraphobia with panic disorder: Secondary | ICD-10-CM | POA: Diagnosis not present

## 2023-08-20 DIAGNOSIS — R61 Generalized hyperhidrosis: Secondary | ICD-10-CM | POA: Diagnosis not present
